# Patient Record
Sex: Male | Born: 2000 | Race: White | Hispanic: No | Marital: Single
Health system: Southern US, Community
[De-identification: ages and names within clinical notes are randomized; demographics above are authoritative.]

## PROBLEM LIST (undated history)

## (undated) HISTORY — PX: APPENDECTOMY: SHX54

---
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
## 2019-05-07 DIAGNOSIS — H5213 Myopia, bilateral: Secondary | ICD-10-CM | POA: Diagnosis not present
